# Patient Record
Sex: Male | Born: 1985 | Race: Black or African American | Hispanic: No | Marital: Single | Smoking: Never smoker
Health system: Southern US, Community
[De-identification: ages and names within clinical notes are randomized; demographics above are authoritative.]

## PROBLEM LIST (undated history)

## (undated) DIAGNOSIS — H9190 Unspecified hearing loss, unspecified ear: Secondary | ICD-10-CM

---

## 2013-10-26 ENCOUNTER — Emergency Department (HOSPITAL_COMMUNITY)
Admission: EM | Admit: 2013-10-26 | Discharge: 2013-10-26 | Disposition: A | Discharge status: Home / Self Care | Payer: Medicaid Other | Attending: Emergency Medicine | Admitting: Emergency Medicine

## 2013-10-26 ENCOUNTER — Emergency Department (HOSPITAL_COMMUNITY): Payer: Medicaid Other

## 2013-10-26 ENCOUNTER — Encounter (HOSPITAL_COMMUNITY): Payer: Self-pay | Admitting: Emergency Medicine

## 2013-10-26 DIAGNOSIS — H9190 Unspecified hearing loss, unspecified ear: Secondary | ICD-10-CM | POA: Insufficient documentation

## 2013-10-26 DIAGNOSIS — R112 Nausea with vomiting, unspecified: Secondary | ICD-10-CM | POA: Diagnosis not present

## 2013-10-26 DIAGNOSIS — R1115 Cyclical vomiting syndrome unrelated to migraine: Secondary | ICD-10-CM

## 2013-10-26 DIAGNOSIS — R1013 Epigastric pain: Secondary | ICD-10-CM | POA: Diagnosis not present

## 2013-10-26 HISTORY — DX: Unspecified hearing loss, unspecified ear: H91.90

## 2013-10-26 LAB — URINALYSIS, ROUTINE W REFLEX MICROSCOPIC
GLUCOSE, UA: NEGATIVE mg/dL
HGB URINE DIPSTICK: NEGATIVE
Leukocytes, UA: NEGATIVE
Nitrite: NEGATIVE
PH: 8 (ref 5.0–8.0)
Protein, ur: 30 mg/dL — AB
Specific Gravity, Urine: 1.038 — ABNORMAL HIGH (ref 1.005–1.030)
Urobilinogen, UA: 1 mg/dL (ref 0.0–1.0)

## 2013-10-26 LAB — CBC WITH DIFFERENTIAL/PLATELET
Basophils Absolute: 0 10*3/uL (ref 0.0–0.1)
Basophils Relative: 0 % (ref 0–1)
Eosinophils Absolute: 0.1 10*3/uL (ref 0.0–0.7)
Eosinophils Relative: 2 % (ref 0–5)
HEMATOCRIT: 43 % (ref 39.0–52.0)
Hemoglobin: 15.3 g/dL (ref 13.0–17.0)
LYMPHS ABS: 0.8 10*3/uL (ref 0.7–4.0)
LYMPHS PCT: 13 % (ref 12–46)
MCH: 31.2 pg (ref 26.0–34.0)
MCHC: 35.6 g/dL (ref 30.0–36.0)
MCV: 87.8 fL (ref 78.0–100.0)
MONO ABS: 0.4 10*3/uL (ref 0.1–1.0)
Monocytes Relative: 6 % (ref 3–12)
Neutro Abs: 4.9 10*3/uL (ref 1.7–7.7)
Neutrophils Relative %: 79 % — ABNORMAL HIGH (ref 43–77)
Platelets: 231 10*3/uL (ref 150–400)
RBC: 4.9 MIL/uL (ref 4.22–5.81)
RDW: 12.2 % (ref 11.5–15.5)
WBC: 6.3 10*3/uL (ref 4.0–10.5)

## 2013-10-26 LAB — COMPREHENSIVE METABOLIC PANEL
ALT: 22 U/L (ref 0–53)
AST: 22 U/L (ref 0–37)
Albumin: 4 g/dL (ref 3.5–5.2)
Alkaline Phosphatase: 90 U/L (ref 39–117)
Anion gap: 13 (ref 5–15)
BILIRUBIN TOTAL: 1.3 mg/dL — AB (ref 0.3–1.2)
BUN: 14 mg/dL (ref 6–23)
CO2: 27 meq/L (ref 19–32)
Calcium: 9.2 mg/dL (ref 8.4–10.5)
Chloride: 99 mEq/L (ref 96–112)
Creatinine, Ser: 1.03 mg/dL (ref 0.50–1.35)
GFR calc Af Amer: >90 mL/min (ref >90)
GLUCOSE: 103 mg/dL — AB (ref 70–99)
Potassium: 3.9 mEq/L (ref 3.7–5.3)
SODIUM: 139 meq/L (ref 137–147)
Total Protein: 7.9 g/dL (ref 6.0–8.3)

## 2013-10-26 LAB — RAPID URINE DRUG SCREEN, HOSP PERFORMED
AMPHETAMINES: NOT DETECTED
BARBITURATES: NOT DETECTED
BENZODIAZEPINES: NOT DETECTED
Cocaine: NOT DETECTED
Opiates: NOT DETECTED
TETRAHYDROCANNABINOL: POSITIVE — AB

## 2013-10-26 LAB — URINE MICROSCOPIC-ADD ON

## 2013-10-26 LAB — LIPASE, BLOOD: Lipase: 11 U/L (ref 11–59)

## 2013-10-26 MED ORDER — PROMETHAZINE HCL 25 MG/ML IJ SOLN
12.5000 mg | INTRAMUSCULAR | Status: DC | PRN
  Filled 2013-10-26 (×2): qty 1

## 2013-10-26 MED ORDER — FENTANYL CITRATE 0.05 MG/ML IJ SOLN
100.0000 ug | Freq: Once | INTRAMUSCULAR | Status: AC
  Administered 2013-10-26: 100 ug via INTRAVENOUS
  Filled 2013-10-26: qty 2

## 2013-10-26 MED ORDER — FENTANYL CITRATE 0.05 MG/ML IJ SOLN
INTRAMUSCULAR | Status: AC
  Filled 2013-10-26: qty 2

## 2013-10-26 MED ORDER — FENTANYL CITRATE 0.05 MG/ML IJ SOLN
50.0000 ug | Freq: Once | INTRAMUSCULAR | Status: AC
  Administered 2013-10-26: 50 ug via NASAL

## 2013-10-26 MED ORDER — ONDANSETRON HCL 4 MG/2ML IJ SOLN: 4.0000 mg | Freq: Once | INTRAMUSCULAR | Status: DC

## 2013-10-26 MED ORDER — SODIUM CHLORIDE 0.9 % IV BOLUS (SEPSIS)
1000.0000 mL | Freq: Once | INTRAVENOUS | Status: AC
  Administered 2013-10-26: 1000 mL via INTRAVENOUS

## 2013-10-26 MED ORDER — PROMETHAZINE HCL 25 MG PO TABS: 25.0000 mg | ORAL_TABLET | Freq: Four times a day (QID) | ORAL | Status: DC | PRN

## 2013-10-26 MED ORDER — ONDANSETRON 4 MG PO TBDP
8.0000 mg | ORAL_TABLET | Freq: Once | ORAL | Status: AC
  Administered 2013-10-26: 8 mg via ORAL
  Filled 2013-10-26: qty 2

## 2013-10-26 NOTE — Discharge Instructions (Signed)
Cyclic Vomiting Syndrome Cyclic vomiting syndrome is a benign condition in which patients experience bouts or cycles of severe nausea and vomiting that last for hours or even days. The bouts of nausea and vomiting alternate with longer periods of no symptoms and generally good health. Cyclic vomiting syndrome occurs mostly in children, but can affect adults. CAUSES  [facility] has no known cause. Each episode is typically similar to the previous ones. The episodes tend to:   Start at about the same time of day.  Last the same length of time.  Present the same symptoms at the same level of intensity. Cyclic vomiting syndrome can begin at any age in children and adults. Cyclic vomiting syndrome usually starts between the ages of 3 and 7 years. In adults, episodes tend to occur less often than they do in children, but they last longer. Furthermore, the events or situations that trigger episodes in adults cannot always be pinpointed as easily as they can in children. There are 4 phases of cyclic vomiting syndrome: 1. Prodrome. The prodrome phase signals that an episode of nausea and vomiting is about to begin. This phase can last from just a few minutes to several hours. This phase is often marked by belly (abdominal) pain. Sometimes taking medicine early in the prodrome phase can stop an episode in progress. However, sometimes there is no warning. A person may simply wake up in the middle of the night or early morning and begin vomiting. 2. Episode. The episode phase consists of:  Severe vomiting.  Nausea.  Gagging (retching). 3. Recovery. The recovery phase begins when the nausea and vomiting stop. Healthy color, appetite, and energy return. 4. Symptom-free interval. The symptom-free interval phase is the period between episodes when no symptoms are present. TRIGGERS Episodes can be triggered by an infection or event. Examples of triggers include:    Smoking Marijuana   Infections.  Colds,  allergies, sinus problems, and the flu.  Eating certain foods such as chocolate or cheese.  Foods with monosodium glutamate (MSG) or preservatives.  Fast foods.  Pre-packaged foods.  Foods with low nutritional value (junk foods).  Overeating.  Eating just before going to bed.  Hot weather.  Dehydration.  Not enough sleep or poor sleep quality.  Physical exhaustion.  Menstruation.  Motion sickness.  Emotional stress (school or home difficulties).  Excitement or stress. SYMPTOMS  The main symptoms of cyclic vomiting syndrome are:  Severe vomiting.  Nausea.  Gagging (retching). Episodes usually begin at night or the first thing in the morning. Episodes may include vomiting or retching up to 5 or 6 times an hour during the worst of the episode. Episodes usually last anywhere from 1 to 4 days. Episodes can last for up to 10 days. Other symptoms include:  Paleness.  Exhaustion.  Listlessness.  Abdominal pain.  Loose stools or diarrhea. Sometimes the nausea and vomiting are so severe that a person appears to be almost unconscious. Sensitivity to light, headache, fever, dizziness, may also accompany an episode. In addition, the vomiting may cause drooling and excessive thirst. Drinking water usually leads to more vomiting, though the water can dilute the acid in the vomit, making the episode a little less painful. Continuous vomiting can lead to dehydration, which means that the body has lost excessive water and salts. DIAGNOSIS  Cyclic vomiting syndrome is hard to diagnose because there are no clear tests to identify it. A caregiver must diagnose cyclic vomiting syndrome by looking at symptoms and medical history. A  caregiver must exclude more common diseases or disorders that can also cause nausea and vomiting. Also, diagnosis takes time because caregivers need to identify a pattern or cycle to the vomiting. TREATMENT  Cyclic vomiting syndrome cannot be cured.  Treatment varies, but people with cyclic vomiting syndrome should get plenty of rest and sleep and take medications that prevent, stop, or lessen the vomiting episodes and other symptoms. People whose episodes are frequent and long-lasting may be treated during the symptom-free intervals in an effort to prevent or ease future episodes. The symptom-free phase is a good time to eliminate anything known to trigger an episode. For example, if episodes are brought on by stress or excitement, this period is the time to find ways to reduce stress and stay calm. If sinus problems or allergies cause episodes, those conditions should be treated. The triggers listed above should be avoided or prevented. Because of the similarities between migraine and cyclic vomiting syndrome, caregivers treat some people with severe cyclic vomiting syndrome with drugs that are also used for migraine headaches. The drugs are designed to:  Prevent episodes.  Reduce their frequency.  Lessen their severity. HOME CARE INSTRUCTIONS Once a vomiting episode begins, treatment is supportive. It helps to stay in bed and sleep in a dark, quiet room. Severe nausea and vomiting may require hospitalization and intravenous (IV) fluids to prevent dehydration. Relaxing medications (sedatives) may help if the nausea continues. Sometimes, during the prodrome phase, it is possible to stop an episode from happening altogether. Only take over-the-counter or prescription medicines for pain, discomfort or fever as directed by your caregiver. Do not give aspirin to children. During the recovery phase, drinking water and replacing lost electrolytes (salts in the blood) are very important. Electrolytes are salts that the body needs to function well and stay healthy. Symptoms during the recovery phase can vary. Some people find that their appetites return to normal immediately, while others need to begin by drinking clear liquids and then move slowly to solid  food. RELATED COMPLICATIONS The severe vomiting that defines cyclic vomiting syndrome is a risk factor for several complications:  Dehydration--Vomiting causes the body to lose water quickly.  Electrolyte imbalance--Vomiting also causes the body to lose the important salts it needs to keep working properly.  Peptic esophagitis--The tube that connects the mouth to the stomach (esophagus) becomes injured from the stomach acid that comes up with the vomit.  Hematemesis--The esophagus becomes irritated and bleeds, so blood mixes with the vomit.  Mallory-Weiss tear--The lower end of the esophagus may tear open or the stomach may bruise from vomiting or retching.  Tooth decay--The acid in the vomit can hurt the teeth by corroding the tooth enamel. SEEK MEDICAL CARE IF: You have questions or problems. Document Released: 03/29/2001 Document Revised: 04/12/2011 Document Reviewed: 04/27/2010 [facility] Patient Information 2015 [place], [place]. This information is not intended to replace advice given to you by your health care provider. Make sure you discuss any questions you have with your health care provider.

## 2013-10-26 NOTE — ED Notes (Signed)
Pt. Refused wheelchair 

## 2013-10-26 NOTE — ED Notes (Signed)
Sign language interpretor here with pt.

## 2013-10-26 NOTE — ED Notes (Signed)
Pt is deaf, per interpretor pt is having epigastric pain pain and n/v since this am. Denies diarrhea. No acute distress is noted at this time.

## 2013-10-26 NOTE — ED Provider Notes (Signed)
CSN: [id number]     Arrival date & time 10/26/13  1716 History   First MD Initiated Contact with Patient 10/26/13 1949     Chief Complaint  Patient presents with  . Abdominal Pain  . Emesis      HPI Pt is deaf, per interpretor pt is having epigastric pain pain and n/v since this am. Denies diarrhea. No acute distress is noted at this time.  Past Medical History  Diagnosis Date  . Deaf    History reviewed. No pertinent past surgical history. History reviewed. No pertinent family history. History  Substance Use Topics  . Smoking status: Not on file  . Smokeless tobacco: Not on file  . Alcohol Use: No    Review of Systems  Unable to perform ROS: Patient nonverbal      Allergies  Review of patient's allergies indicates no known allergies.  Home Medications   Prior to Admission medications   Medication Sig Start Date End Date Taking? Authorizing Provider  promethazine (PHENERGAN) 25 MG tablet Take 1 tablet (25 mg total) by mouth every 6 (six) hours as needed for nausea or vomiting. 10/26/13   Nelia Shi, MD   BP 111/67  Pulse 91  Temp(Src) 99.2 F (37.3 C) (Oral)  Resp 20  SpO2 99% Physical Exam  Nursing note and vitals reviewed. Constitutional: He is oriented to person, place, and time. He appears well-developed and well-nourished. No distress.  HENT:  Head: Normocephalic and atraumatic.  Eyes: Pupils are equal, round, and reactive to light.  Neck: Normal range of motion.  Cardiovascular: Normal rate and intact distal pulses.   Pulmonary/Chest: No respiratory distress.  Abdominal: Normal appearance. He exhibits no distension and no mass. There is no tenderness. There is no rebound and no guarding.  Musculoskeletal: Normal range of motion.  Neurological: He is alert and oriented to person, place, and time. No cranial nerve deficit.  Skin: Skin is warm and dry. No rash noted.  Psychiatric: He has a normal mood and affect. His behavior is normal.    ED  Course  Procedures (including critical care time) Medications  fentaNYL (SUBLIMAZE) 0.05 MG/ML injection (  Not Given 10/26/13 2037)  promethazine (PHENERGAN) injection 12.5 mg (not administered)  fentaNYL (SUBLIMAZE) injection 50 mcg (50 mcg Nasal Given 10/26/13 1900)  ondansetron (ZOFRAN-ODT) disintegrating tablet 8 mg (8 mg Oral Given 10/26/13 1859)  sodium chloride 0.9 % bolus 1,000 mL (0 mLs Intravenous Stopped 10/26/13 2300)  fentaNYL (SUBLIMAZE) injection 100 mcg (100 mcg Intravenous Given 10/26/13 2018)    Labs Review Labs Reviewed  CBC WITH DIFFERENTIAL - Abnormal; Notable for the following:    Neutrophils Relative % 79 (*)    All other components within normal limits  COMPREHENSIVE METABOLIC PANEL - Abnormal; Notable for the following:    Glucose, Bld 103 (*)    Total Bilirubin 1.3 (*)    All other components within normal limits  URINALYSIS, ROUTINE W REFLEX MICROSCOPIC - Abnormal; Notable for the following:    Color, Urine AMBER (*)    Specific Gravity, Urine 1.038 (*)    Bilirubin Urine SMALL (*)    Ketones, ur >80 (*)    Protein, ur 30 (*)    All other components within normal limits  URINE RAPID DRUG SCREEN (HOSP PERFORMED) - Abnormal; Notable for the following:    Tetrahydrocannabinol POSITIVE (*)    All other components within normal limits  URINE MICROSCOPIC-ADD ON - Abnormal; Notable for the following:    Casts  HYALINE CASTS (*)    All other components within normal limits  LIPASE, BLOOD    Imaging Review Ct Renal Stone Study  10/26/2013   CLINICAL DATA:  Epigastric pain and nausea and vomiting since this morning.  EXAM: CT ABDOMEN AND PELVIS WITHOUT CONTRAST  TECHNIQUE: Multidetector CT imaging of the abdomen and pelvis was performed following the standard protocol without IV contrast.  COMPARISON:  None.  FINDINGS: 3 mm nodule in the left lung base probably represents calcified granuloma.  The kidneys appear symmetrical in size and shape. No hydronephrosis or  hydroureter. No renal, ureteral, or bladder stones. The bladder is decompressed.  The unenhanced appearance of the liver, spleen, gallbladder, pancreas, adrenal glands, kidneys, abdominal aorta, inferior vena cava, and retroperitoneal lymph nodes is unremarkable. The stomach is mildly distended with fluid and ingested material. Wall is not apparently thickened. Small bowel and colon are decompressed. No free air or free fluid in the abdomen. Abdominal wall musculature appears intact.  Pelvis: No pelvic mass or lymphadenopathy. No free or loculated pelvic fluid collections. No inflammatory changes in the sigmoid colon. The appendix is not identified but no inflammatory changes are suggested in the right lower quadrant. No destructive bone lesions.  IMPRESSION: No renal or ureteral stone or obstruction. No acute process demonstrated in the abdomen or pelvis on unenhanced imaging.   Electronically Signed   By: Burman Nieves M.D.   On: 10/26/2013 21:54     EKG Interpretation   Date/Time:  Friday October 26 2013 17:25:24 EDT Ventricular Rate:  118 PR Interval:  138 QRS Duration: 72 QT Interval:  296 QTC Calculation: 414 R Axis:   75 Text Interpretation:  Sinus tachycardia Biatrial enlargement Nonspecific  ST abnormality Abnormal ECG No previous tracing Confirmed by Symon Norwood  MD,  Terri Rorrer ([id number]) on 10/26/2013 7:49:57 PM     1 I discussed his marijuana use he had with  his wife. She admitted that he smokes a lot of marijuana.  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  Patient feeling much better and stable at time of discharge. MDM   Final diagnoses:  Non-intractable cyclical vomiting with nausea        Nelia Shi, MD 10/26/13 2312

## 2015-02-02 ENCOUNTER — Encounter (HOSPITAL_COMMUNITY): Payer: Self-pay | Admitting: Emergency Medicine

## 2015-02-02 ENCOUNTER — Emergency Department (HOSPITAL_COMMUNITY)
Admission: EM | Admit: 2015-02-02 | Discharge: 2015-02-02 | Disposition: A | Discharge status: Home / Self Care | Payer: Medicaid Other | Attending: Emergency Medicine | Admitting: Emergency Medicine

## 2015-02-02 DIAGNOSIS — Z79899 Other long term (current) drug therapy: Secondary | ICD-10-CM | POA: Insufficient documentation

## 2015-02-02 DIAGNOSIS — R1013 Epigastric pain: Secondary | ICD-10-CM | POA: Insufficient documentation

## 2015-02-02 DIAGNOSIS — H9190 Unspecified hearing loss, unspecified ear: Secondary | ICD-10-CM | POA: Insufficient documentation

## 2015-02-02 DIAGNOSIS — N179 Acute kidney failure, unspecified: Secondary | ICD-10-CM

## 2015-02-02 DIAGNOSIS — R1110 Vomiting, unspecified: Secondary | ICD-10-CM | POA: Diagnosis present

## 2015-02-02 DIAGNOSIS — R197 Diarrhea, unspecified: Secondary | ICD-10-CM

## 2015-02-02 DIAGNOSIS — R112 Nausea with vomiting, unspecified: Secondary | ICD-10-CM

## 2015-02-02 LAB — COMPREHENSIVE METABOLIC PANEL
ALBUMIN: 4.4 g/dL (ref 3.5–5.0)
ALK PHOS: 67 U/L (ref 38–126)
ALT: 30 U/L (ref 17–63)
AST: 37 U/L (ref 15–41)
Anion gap: 15 (ref 5–15)
BILIRUBIN TOTAL: 1.7 mg/dL — AB (ref 0.3–1.2)
BUN: 16 mg/dL (ref 6–20)
CALCIUM: 10.1 mg/dL (ref 8.9–10.3)
CO2: 20 mmol/L — ABNORMAL LOW (ref 22–32)
Chloride: 105 mmol/L (ref 101–111)
Creatinine, Ser: 1.41 mg/dL — ABNORMAL HIGH (ref 0.61–1.24)
GFR calc Af Amer: >60 mL/min (ref >60)
GFR calc non Af Amer: >60 mL/min (ref >60)
GLUCOSE: 148 mg/dL — AB (ref 65–99)
POTASSIUM: 3.7 mmol/L (ref 3.5–5.1)
SODIUM: 140 mmol/L (ref 135–145)
Total Protein: 8 g/dL (ref 6.5–8.1)

## 2015-02-02 LAB — URINALYSIS, ROUTINE W REFLEX MICROSCOPIC
BILIRUBIN URINE: NEGATIVE
Glucose, UA: NEGATIVE mg/dL
HGB URINE DIPSTICK: NEGATIVE
Ketones, ur: >80 mg/dL — AB
Leukocytes, UA: NEGATIVE
Nitrite: NEGATIVE
PH: 5.5 (ref 5.0–8.0)
Protein, ur: NEGATIVE mg/dL
SPECIFIC GRAVITY, URINE: 1.029 (ref 1.005–1.030)

## 2015-02-02 LAB — CBC
HEMATOCRIT: 42.4 % (ref 39.0–52.0)
Hemoglobin: 14.3 g/dL (ref 13.0–17.0)
MCH: 30.1 pg (ref 26.0–34.0)
MCHC: 33.7 g/dL (ref 30.0–36.0)
MCV: 89.3 fL (ref 78.0–100.0)
Platelets: 203 10*3/uL (ref 150–400)
RBC: 4.75 MIL/uL (ref 4.22–5.81)
RDW: 12.5 % (ref 11.5–15.5)
WBC: 9.8 10*3/uL (ref 4.0–10.5)

## 2015-02-02 LAB — LIPASE, BLOOD: Lipase: 19 U/L (ref 11–51)

## 2015-02-02 MED ORDER — ONDANSETRON 4 MG PO TBDP
ORAL_TABLET | ORAL | Status: AC
  Filled 2015-02-02: qty 2

## 2015-02-02 MED ORDER — FENTANYL CITRATE (PF) 100 MCG/2ML IJ SOLN
INTRAMUSCULAR | Status: AC
  Filled 2015-02-02: qty 2

## 2015-02-02 MED ORDER — PROMETHAZINE HCL 25 MG PO TABS: 25.0000 mg | ORAL_TABLET | Freq: Four times a day (QID) | ORAL | Status: AC | PRN

## 2015-02-02 MED ORDER — DICYCLOMINE HCL 20 MG PO TABS: 20.0000 mg | ORAL_TABLET | Freq: Two times a day (BID) | ORAL | Status: DC

## 2015-02-02 MED ORDER — FENTANYL CITRATE (PF) 100 MCG/2ML IJ SOLN
50.0000 ug | Freq: Once | INTRAMUSCULAR | Status: AC
  Administered 2015-02-02: 50 ug via NASAL

## 2015-02-02 MED ORDER — SODIUM CHLORIDE 0.9 % IV BOLUS (SEPSIS): 1000.0000 mL | Freq: Once | INTRAVENOUS | Status: DC

## 2015-02-02 MED ORDER — ONDANSETRON 4 MG PO TBDP
8.0000 mg | ORAL_TABLET | Freq: Once | ORAL | Status: AC
  Administered 2015-02-02: 8 mg via ORAL

## 2015-02-02 MED ORDER — FAMOTIDINE 20 MG PO TABS: 20.0000 mg | ORAL_TABLET | Freq: Two times a day (BID) | ORAL | Status: AC

## 2015-02-02 MED ORDER — SODIUM CHLORIDE 0.9 % IV BOLUS (SEPSIS)
2000.0000 mL | Freq: Once | INTRAVENOUS | Status: AC
Start: 2015-02-02 — End: 2015-02-02
  Administered 2015-02-02: 2000 mL via INTRAVENOUS

## 2015-02-02 MED ORDER — ONDANSETRON HCL 4 MG/2ML IJ SOLN
4.0000 mg | Freq: Once | INTRAMUSCULAR | Status: AC
  Administered 2015-02-02: 4 mg via INTRAVENOUS
  Filled 2015-02-02: qty 2

## 2015-02-02 NOTE — ED Provider Notes (Signed)
CSN: [id number]     Arrival date & time 02/02/15  1854 History   First MD Initiated Contact with Patient 02/02/15 2041     Chief Complaint  Patient presents with  . Emesis     (Consider location/radiation/quality/duration/timing/severity/associated sxs/prior Treatment) HPI  Deaf interpreter used for communication  Blood pressure 107/73, pulse 115, temperature 97.5 F (36.4 C), temperature source Oral, resp. rate 20, SpO2 100 %.  Nathan Bass is a 30 y.o. male complaining of 5 episodes of nonbloody, nonbilious, non-coffee ground emesis with associated 5 episodes of soft with normal stool and epigastric pain, initially the pain was severe, 7-8 out of 10, to resolve to 2-3 out of 10 at this point. He has positive sick contacts in that his wife and son also have similar symptoms. Denies fever, chills, dysuria, decreased urinary output, history of kidney disease.  Past Medical History  Diagnosis Date  . Deaf    History reviewed. No pertinent past surgical history. No family history on file. Social History  Substance Use Topics  . Smoking status: Never Smoker   . Smokeless tobacco: None  . Alcohol Use: No    Review of Systems  10 systems reviewed and found to be negative, except as noted in the HPI.   Allergies  Review of patient's allergies indicates no known allergies.  Home Medications   Prior to Admission medications   Medication Sig Start Date End Date Taking? Authorizing Provider  acetaminophen (TYLENOL) 500 MG tablet Take 1,000 mg by mouth every 6 (six) hours as needed for mild pain.   Yes Historical Provider, MD  ibuprofen (ADVIL,MOTRIN) 200 MG tablet Take 400-800 mg by mouth every 6 (six) hours as needed for headache.   Yes Historical Provider, MD  dicyclomine (BENTYL) 20 MG tablet Take 1 tablet (20 mg total) by mouth 2 (two) times daily. 02/02/15   Caster Fayette, PA-C  famotidine (PEPCID) 20 MG tablet Take 1 tablet (20 mg total) by mouth 2 (two) times daily.  02/02/15   Ronnika Collett, PA-C  promethazine (PHENERGAN) 25 MG tablet Take 1 tablet (25 mg total) by mouth every 6 (six) hours as needed for nausea or vomiting. 02/02/15   Genia Perin, PA-C   BP 106/75 mmHg  Pulse 93  Temp(Src) 98.7 F (37.1 C) (Oral)  Resp 16  SpO2 100% Physical Exam  Constitutional: He is oriented to person, place, and time. He appears well-developed and well-nourished. No distress.  HENT:  Head: Normocephalic and atraumatic.  Mouth/Throat: Oropharynx is clear and moist.  Eyes: Conjunctivae and EOM are normal.  Cardiovascular: Normal rate, regular rhythm and intact distal pulses.   Pulmonary/Chest: Effort normal and breath sounds normal. No stridor. No respiratory distress. He has no rales. He exhibits no tenderness.  Abdominal: Soft. Bowel sounds are normal. He exhibits no distension and no mass. There is tenderness. There is no rebound and no guarding.  Mild, diffuse tenderness to palpation in the epigastrium with no guarding or rebound.  Murphy sign negative, no tenderness to palpation over McBurney's point, Rovsings, Psoas and obturator all negative.   Musculoskeletal: Normal range of motion.  Neurological: He is alert and oriented to person, place, and time.  Psychiatric: He has a normal mood and affect.  Nursing note and vitals reviewed.   ED Course  Procedures (including critical care time) Labs Review Labs Reviewed  COMPREHENSIVE METABOLIC PANEL - Abnormal; Notable for the following:    CO2 20 (*)    Glucose, Bld 148 (*)  Creatinine, Ser 1.41 (*)    Total Bilirubin 1.7 (*)    All other components within normal limits  URINALYSIS, ROUTINE W REFLEX MICROSCOPIC (NOT AT [facility]) - Abnormal; Notable for the following:    Color, Urine AMBER (*)    Ketones, ur >80 (*)    All other components within normal limits  LIPASE, BLOOD  CBC    Imaging Review No results found. I have personally reviewed and evaluated these images and lab results as part of  my medical decision-making.   EKG Interpretation None      MDM   Final diagnoses:  Acute kidney injury (HCC)  Nausea vomiting and diarrhea  Epigastric abdominal pain    Filed Vitals:   02/02/15 1922 02/02/15 2304  BP: 107/73 106/75  Pulse: 115 93  Temp: 97.5 F (36.4 C) 98.7 F (37.1 C)  TempSrc: Oral Oral  Resp: 20 16  SpO2: 100% 100%    Medications  fentaNYL (SUBLIMAZE) injection 50 mcg (50 mcg Nasal Given 02/02/15 1937)  ondansetron (ZOFRAN-ODT) disintegrating tablet 8 mg (8 mg Oral Given 02/02/15 1937)  ondansetron (ZOFRAN) injection 4 mg (4 mg Intravenous Given 02/02/15 2113)  sodium chloride 0.9 % bolus 2,000 mL (0 mLs Intravenous Stopped 02/02/15 2305)    Nathan Bass is 30 y.o. male presenting with nausea vomiting diarrhea and improved abdominal pain onset today, wife and daughter sick with similar. Abdominal exam is nonsurgical. Patient is initially tachycardic however this is resolved after fluids. He has elevated creatinine at 1.4, no history of kidney dysfunction. He also has 80 ketones in the urine, no significant hyperglycemia to suggest DKA. Repeat abdominal exam remains benign, patient has passed oral fluid challenge and  feels much better.    She has appointment with primary care physician tomorrow morning, advised him to push fluids, stay away from NSAIDs he will need to have his creatinine rechecked tomorrow.  Evaluation does not show pathology that would require ongoing emergent intervention or inpatient treatment. Pt is hemodynamically stable and mentating appropriately. Discussed findings and plan with patient/guardian, who agrees with care plan. All questions answered. Return precautions discussed and outpatient follow up given.    [name] Anayeli Arel, PA-C 02/02/15 1610  Derwood Kaplan, MD 02/05/15 [phone]

## 2015-02-02 NOTE — ED Notes (Signed)
Pt. reports mid abdominal pain with emesis and diarrhea onset this afternoon , denies fever or chills.

## 2015-02-02 NOTE — Discharge Instructions (Signed)
Your kidney tests today are not normal. You need to drink plenty of water and have this rechecked by your primary care doctor in the next week. Do not take any NSAIDs (motrin, ibuprofen, Advil, aleve , aspirin, naproxen etc.) because they can further hurt your kidneys.   Return to the emergency room for severely worsening abdominal pain, abdominal pain that localizes to a particular area (especially the right lower part of the belly), pain that persists past 8-10 hours, blood in stool or vomit, severe weakness, fainting, or fever.   Push fluids: take small frequent sips of water or Gatorade, do not drink any soda, juice or caffeinated beverages.    Slowly resume solid diet as desired. Avoid food that are spicy, contain dairy and/or have high fat content.   Acute Kidney Injury Acute kidney injury is any condition in which there is sudden (acute) damage to the kidneys. Acute kidney injury was previously known as acute kidney failure or acute renal failure. The kidneys are two organs that lie on either side of the spine between the middle of the back and the front of the abdomen. The kidneys:  Remove wastes and extra water from the blood.   Produce important hormones. These help keep bones strong, regulate blood pressure, and help create red blood cells.   Balance the fluids and chemicals in the blood and tissues. A small amount of kidney damage may not cause problems, but a large amount of damage may make it difficult or impossible for the kidneys to work the way they should. Acute kidney injury may develop into long-lasting (chronic) kidney disease. It may also develop into a life-threatening disease called end-stage kidney disease. Acute kidney injury can get worse very quickly, so it should be treated right away. Early treatment may prevent other kidney diseases from developing. CAUSES   A problem with blood flow to the kidneys. This may be caused by:   Blood loss.   Heart disease.    Severe burns.   Liver disease.  Direct damage to the kidneys. This may be caused by:  Some medicines.   A kidney infection.   Poisoning or consuming toxic substances.   A surgical wound.   A blow to the kidney area.   A problem with urine flow. This may be caused by:   Cancer.   Kidney stones.   An enlarged prostate. SIGNS AND SYMPTOMS   Swelling (edema) of the legs, ankles, or feet.   Tiredness (lethargy).   Nausea or vomiting.   Confusion.   Problems with urination, such as:   Painful or burning feeling during urination.   Decreased urine production.   Frequent accidents in children who are potty trained.   Bloody urine.   Muscle twitches and cramps.   Shortness of breath.   Seizures.   Chest pain or pressure. Sometimes, no symptoms are present. DIAGNOSIS Acute kidney injury may be detected and diagnosed by tests, including blood, urine, imaging, or kidney biopsy tests.  TREATMENT Treatment of acute kidney injury varies depending on the cause and severity of the kidney damage. In mild cases, no treatment may be needed. The kidneys may heal on their own. If acute kidney injury is more severe, your health care provider will treat the cause of the kidney damage, help the kidneys heal, and prevent complications from occurring. Severe cases may require a procedure to remove toxic wastes from the body (dialysis) or surgery to repair kidney damage. Surgery may involve:   Repair of a  torn kidney.   Removal of an obstruction. HOME CARE INSTRUCTIONS  Follow your prescribed diet.  Take medicines only as directed by your health care provider.  Do not take any new medicines (prescription, over-the-counter, or nutritional supplements) unless approved by your health care provider. Many medicines can worsen your kidney damage or may need to have the dose adjusted.   Keep all follow-up visits as directed by your health care provider.  This is important.  Observe your condition to make sure you are healing as expected. SEEK IMMEDIATE MEDICAL CARE IF:  You are feeling ill or have severe pain in the back or side.   Your symptoms return or you have new symptoms.  You have any symptoms of end-stage kidney disease. These include:   Persistent itchiness.   Loss of appetite.   Headaches.   Abnormally dark or light skin.  Numbness in the hands or feet.   Easy bruising.   Frequent hiccups.   Menstruation stops.   You have a fever.  You have increased urine production.  You have pain or bleeding when urinating. MAKE SURE YOU:   Understand these instructions.  Will watch your condition.  Will get help right away if you are not doing well or get worse.   This information is not intended to replace advice given to you by your health care provider. Make sure you discuss any questions you have with your health care provider.   Document Released: 08/03/2010 Document Revised: 02/08/2014 Document Reviewed: 09/17/2011 Elsevier Interactive Patient Education [employer]2016 [employer]

## 2015-02-02 NOTE — ED Notes (Signed)
Patient verbalized understanding of discharge instructions and denies any further needs or questions at this time. VS stable. Patient ambulatory with steady gait.  

## 2015-08-29 ENCOUNTER — Encounter (HOSPITAL_COMMUNITY): Payer: Self-pay | Admitting: Emergency Medicine

## 2015-08-29 ENCOUNTER — Ambulatory Visit (HOSPITAL_COMMUNITY)
Admission: EM | Admit: 2015-08-29 | Discharge: 2015-08-29 | Disposition: A | Discharge status: Home / Self Care | Payer: Medicaid Other | Attending: Emergency Medicine | Admitting: Emergency Medicine

## 2015-08-29 DIAGNOSIS — R109 Unspecified abdominal pain: Secondary | ICD-10-CM | POA: Diagnosis not present

## 2015-08-29 LAB — POCT I-STAT, CHEM 8
BUN: 8 mg/dL (ref 6–20)
CALCIUM ION: 1.24 mmol/L (ref 1.13–1.30)
CREATININE: 1 mg/dL (ref 0.61–1.24)
Chloride: 98 mmol/L — ABNORMAL LOW (ref 101–111)
GLUCOSE: 93 mg/dL (ref 65–99)
HCT: 46 % (ref 39.0–52.0)
HEMOGLOBIN: 15.6 g/dL (ref 13.0–17.0)
Potassium: 4.1 mmol/L (ref 3.5–5.1)
Sodium: 140 mmol/L (ref 135–145)
TCO2: 28 mmol/L (ref 0–100)

## 2015-08-29 MED ORDER — DICYCLOMINE HCL 20 MG PO TABS: 20.0000 mg | ORAL_TABLET | Freq: Three times a day (TID) | ORAL | 0 refills | Status: AC

## 2015-08-29 NOTE — ED Triage Notes (Signed)
The patient presented to the [facility] with a complaint of abdominal cramping that started 2 days ago after eating greasy food.

## 2015-08-29 NOTE — ED Notes (Signed)
Video interpreter # [phone] used for triage.

## 2015-08-29 NOTE — Discharge Instructions (Signed)
So sorry you are having GI problems. The medication may help with the spasms and cramping. As we discussed please keep f/u with new PCP for further evaluation and any proper referrals. Nice to meet you, may want to avoid foods high in fat and grease until appts can be arranged.

## 2015-08-29 NOTE — ED Provider Notes (Signed)
CSN: [id number]     Arrival date & time 08/29/15  1152 History   None    Chief Complaint  Patient presents with  . Abdominal Pain   (Consider location/radiation/quality/duration/timing/severity/associated sxs/prior Treatment) 30 yo black male presents with abdominal "cramping". His history is obtained through sign language appointed to him.  Patient reports periodic cramping for 2 weeks. He was seen in the ER in [place] and per patient had a normal CT scan of his abdomen. By report his blood work was also normal. His pain is peri-umbilical. It can occur after eating but not always. In the last few days he notes that these episodes are more frequent. He has no diarrhea, nausea or vomiting. No constipation. No fever or chills and no urinary symptoms. See remainder of ROS.     Past Medical History:  Diagnosis Date  . Deaf    History reviewed. No pertinent surgical history. History reviewed. No pertinent family history. Social History  Substance Use Topics  . Smoking status: Never Smoker  . Smokeless tobacco: Never Used  . Alcohol use No    Review of Systems  Constitutional: Negative for chills, fever and unexpected weight change.  Gastrointestinal: Positive for abdominal pain. Negative for abdominal distention, anal bleeding, blood in stool, constipation, diarrhea, nausea and vomiting.  Genitourinary: Negative.   Musculoskeletal: Negative for back pain.  Skin: Negative for rash.    Allergies  Review of patient's allergies indicates no known allergies.  Home Medications   Prior to Admission medications   Medication Sig Start Date End Date Taking? Authorizing Provider  promethazine (PHENERGAN) 25 MG tablet Take 1 tablet (25 mg total) by mouth every 6 (six) hours as needed for nausea or vomiting. 02/02/15  Yes [name], PA-C  acetaminophen (TYLENOL) 500 MG tablet Take 1,000 mg by mouth every 6 (six) hours as needed for mild pain.    Historical Provider, MD  dicyclomine  (BENTYL) 20 MG tablet Take 1 tablet (20 mg total) by mouth 3 (three) times daily before meals. 08/29/15   [name], PA-C  famotidine (PEPCID) 20 MG tablet Take 1 tablet (20 mg total) by mouth 2 (two) times daily. 02/02/15   [name], PA-C  ibuprofen (ADVIL,MOTRIN) 200 MG tablet Take 400-800 mg by mouth every 6 (six) hours as needed for headache.    Historical Provider, MD   Meds Ordered and Administered this Visit  Medications - No data to display  BP 123/80 (BP Location: Left Arm)   Pulse 79   Temp 98.3 F (36.8 C) (Oral)   Resp 16   SpO2 100%  No data found.   Physical Exam  Constitutional: He is oriented to person, place, and time. He appears well-developed and well-nourished. No distress.  Lying comfortable on exam table, non-toxic and in no acute distress  Abdominal: Soft. Bowel sounds are normal. He exhibits no distension and no mass. There is tenderness. There is no rebound and no guarding. No hernia.  Pain to palpation peri-umbilical region. No rebound or guarding. Non-acute exam  Neurological: He is alert and oriented to person, place, and time.  Skin: Skin is warm. No rash noted. He is not diaphoretic.  Psychiatric: His behavior is normal.  Nursing note and vitals reviewed.   Urgent Care Course   Clinical Course    Procedures (including critical care time)  Labs Review Labs Reviewed  POCT I-STAT, CHEM 8 - Abnormal; Notable for the following:       Result Value   Chloride  98 (*)    All other components within normal limits    Imaging Review No results found.   Visual Acuity Review  Right Eye Distance:   Left Eye Distance:   Bilateral Distance:    Right Eye Near:   Left Eye Near:    Bilateral Near:         MDM   1. Abdominal cramping    Prior work up in [place] per patient was normal, this included a CT scan and labs. Exam today shows a non-acute abdomen or emergent needs. No signs of infection at this time. He has f/u with a  new PCP under medicaid and I recommend a f/u there so that he can be evaluated further with proper referrals. He did not want to go to ER as his pain is manageable. I refilled Bentyl as this was given in the past for similar issues. Avoid high fat or greasy foods until appropriate f/u. Patient is non-emergent and stable for D/C. Should he worsen he will present to ED.     [name], PA-C 08/29/15 1440

## 2015-08-29 NOTE — ED Notes (Signed)
ASL interpreter used.  

## 2015-12-11 ENCOUNTER — Other Ambulatory Visit: Payer: Self-pay | Admitting: Gastroenterology

## 2015-12-11 DIAGNOSIS — R1013 Epigastric pain: Secondary | ICD-10-CM

## 2015-12-11 DIAGNOSIS — R112 Nausea with vomiting, unspecified: Secondary | ICD-10-CM

## 2015-12-19 ENCOUNTER — Other Ambulatory Visit: Payer: Medicaid Other

## 2016-01-05 ENCOUNTER — Ambulatory Visit
Admission: RE | Admit: 2016-01-05 | Discharge: 2016-01-05 | Disposition: A | Discharge status: Home / Self Care | Payer: Medicaid Other | Source: Ambulatory Visit | Attending: Gastroenterology | Admitting: Gastroenterology

## 2016-01-05 DIAGNOSIS — R112 Nausea with vomiting, unspecified: Secondary | ICD-10-CM

## 2016-01-05 DIAGNOSIS — R1013 Epigastric pain: Secondary | ICD-10-CM

## 2016-01-16 ENCOUNTER — Emergency Department (HOSPITAL_COMMUNITY): Payer: Medicaid Other

## 2016-01-16 ENCOUNTER — Encounter (HOSPITAL_COMMUNITY): Payer: Self-pay | Admitting: Emergency Medicine

## 2016-01-16 ENCOUNTER — Emergency Department (HOSPITAL_COMMUNITY)
Admission: EM | Admit: 2016-01-16 | Discharge: 2016-01-16 | Disposition: A | Discharge status: Home / Self Care | Payer: Medicaid Other | Attending: Emergency Medicine | Admitting: Emergency Medicine

## 2016-01-16 DIAGNOSIS — R110 Nausea: Secondary | ICD-10-CM

## 2016-01-16 DIAGNOSIS — R55 Syncope and collapse: Secondary | ICD-10-CM | POA: Diagnosis present

## 2016-01-16 LAB — URINALYSIS, ROUTINE W REFLEX MICROSCOPIC
Bilirubin Urine: NEGATIVE
GLUCOSE, UA: NEGATIVE mg/dL
Hgb urine dipstick: NEGATIVE
KETONES UR: 80 mg/dL — AB
LEUKOCYTES UA: NEGATIVE
NITRITE: NEGATIVE
PH: 7 (ref 5.0–8.0)
Protein, ur: NEGATIVE mg/dL
SPECIFIC GRAVITY, URINE: 1.014 (ref 1.005–1.030)

## 2016-01-16 LAB — CBG MONITORING, ED: Glucose-Capillary: 91 mg/dL (ref 65–99)

## 2016-01-16 LAB — CBC
HEMATOCRIT: 35.6 % — AB (ref 39.0–52.0)
HEMOGLOBIN: 12.6 g/dL — AB (ref 13.0–17.0)
MCH: 30.7 pg (ref 26.0–34.0)
MCHC: 35.4 g/dL (ref 30.0–36.0)
MCV: 86.6 fL (ref 78.0–100.0)
Platelets: 192 10*3/uL (ref 150–400)
RBC: 4.11 MIL/uL — ABNORMAL LOW (ref 4.22–5.81)
RDW: 12.9 % (ref 11.5–15.5)
WBC: 6.1 10*3/uL (ref 4.0–10.5)

## 2016-01-16 LAB — BASIC METABOLIC PANEL
ANION GAP: 9 (ref 5–15)
BUN: 13 mg/dL (ref 6–20)
CHLORIDE: 101 mmol/L (ref 101–111)
CO2: 26 mmol/L (ref 22–32)
Calcium: 9.3 mg/dL (ref 8.9–10.3)
Creatinine, Ser: 0.99 mg/dL (ref 0.61–1.24)
GFR calc Af Amer: >60 mL/min (ref >60)
GLUCOSE: 99 mg/dL (ref 65–99)
POTASSIUM: 3.5 mmol/L (ref 3.5–5.1)
SODIUM: 136 mmol/L (ref 135–145)

## 2016-01-16 MED ORDER — ONDANSETRON HCL 4 MG/2ML IJ SOLN
4.0000 mg | Freq: Once | INTRAMUSCULAR | Status: AC
  Administered 2016-01-16: 4 mg via INTRAVENOUS
  Filled 2016-01-16: qty 2

## 2016-01-16 MED ORDER — ONDANSETRON 4 MG PO TBDP: 4.0000 mg | ORAL_TABLET | Freq: Three times a day (TID) | ORAL | 0 refills | Status: DC | PRN

## 2016-01-16 NOTE — ED Triage Notes (Signed)
Pt comes from home via EMS with complaints of a syncopal episode that occurred about an hour ago.  Fiance came in and noted patient lying supine on the kitchen floor with his eyes closed.  On arrival, EMS noted patient was still lying on the floor but with eyes open.  When fiance would walk back near the patient, he would close his eyes again.  Sister of pt showed up to scene and stated his mother died recently and he has been having relationship troubles.  Stated he has been seen recently for stomach issues as well and has been taking Protonix and dicyclomine.  Pt told EMS (via writing; pt deaf) that he got dizzy and is feeling nauseated. Pt also complaining of some occipital head pain with no signs of injury or trauma.

## 2016-01-16 NOTE — ED Notes (Signed)
Fluids provided to pt and encouraged to drink 

## 2016-01-16 NOTE — ED Notes (Signed)
Bed: [id number] Expected date:  Expected time:  Means of arrival:  Comments: Syncopal episode/pt is deaf

## 2016-01-16 NOTE — Discharge Instructions (Signed)
Please make an appointment in wellness to establish primary medical care.  Return anytime your symptoms become worse.  He been given a prescription for Zofran that you can use to control periods of nausea

## 2016-01-16 NOTE — ED Notes (Signed)
Pt tolerating oral fluids 

## 2016-01-16 NOTE — ED Notes (Signed)
Pt in X-Ray ?

## 2016-01-16 NOTE — ED Provider Notes (Signed)
[facility]-EMERGENCY DEPT Provider Note   CSN: [id number] Arrival date & time: 01/16/16  [id number] By signing my name below, I, Nathan Bass, attest that this documentation has been prepared under the direction and in the presence of Nathan FavorGail Leo Weyandt, FNP. Electronically Signed: Bridgette HabermannMaria Bass, ED Scribe. 01/16/16. 2:03 AM.  History   Chief Complaint Chief Complaint  Patient presents with  . Loss of Consciousness  . Nausea   HPI Comments: Nathan Bass is a 30 y.o. male with no pertinent PMHx, who presents to the Emergency Department by EMS complaining of a syncopal episode that occurred ~1 hour ago. Pt states he was nauseous all day prior to his syncopal episode, and experienced some dizziness just prior to his episode. He is complaining of mild occipital head pain but denies any visual disturbances. Per EMS, pt was found by fiance, lying supine on the kitchen floor with eyes closed. EMS noted pt was still lying on the floor upon their arrival but with his eyes open. When fiance would walk back near the patient, he would close his eyes again. Pt's sister showed up on scene and told EMS that their mother died recently and he has been having relationship issues with his fiance. Pt does not think these issues are related to his nauseous episodes. He notes that he has had these nauseous episodes for ~2 years already. Has h/o syncopal episodes but does not know the cause. Pt states he has been seen recently for GI issues, and he has been taking Protonix and Dicyclomine.  Denies any recent illness. Pt denies diarrhea, fever, cough, or any other associated symptoms.   The history is provided by the patient and the EMS personnel. No language interpreter was used.    Past Medical History:  Diagnosis Date  . Deaf     There are no active problems to display for this patient.   History reviewed. No pertinent surgical history.     Home Medications    Prior to Admission medications   Medication Sig Start Date  End Date Taking? Authorizing Provider  acetaminophen (TYLENOL) 500 MG tablet Take 1,000 mg by mouth every 6 (six) hours as needed for mild pain.    Historical Provider, MD  dicyclomine (BENTYL) 20 MG tablet Take 1 tablet (20 mg total) by mouth 3 (three) times daily before meals. 08/29/15   Riki SheerMichelle G Young, PA-C  famotidine (PEPCID) 20 MG tablet Take 1 tablet (20 mg total) by mouth 2 (two) times daily. 02/02/15   [name], PA-C  ibuprofen (ADVIL,MOTRIN) 200 MG tablet Take 400-800 mg by mouth every 6 (six) hours as needed for headache.    Historical Provider, MD  promethazine (PHENERGAN) 25 MG tablet Take 1 tablet (25 mg total) by mouth every 6 (six) hours as needed for nausea or vomiting. 02/02/15   Joni ReiningNicole Pisciotta, PA-C    Family History No family history on file.  Social History Social History  Substance Use Topics  . Smoking status: Never Smoker  . Smokeless tobacco: Never Used  . Alcohol use No     Allergies   Patient has no known allergies.   Review of Systems Review of Systems  Constitutional: Negative for chills and fever.  Eyes: Negative for visual disturbance.  Respiratory: Negative for cough.   Gastrointestinal: Positive for nausea. Negative for diarrhea.  Neurological: Positive for dizziness, syncope and headaches.  All other systems reviewed and are negative.    Physical Exam Updated Vital Signs Ht 5\' 10"  (1.778 m)   Wt  145 lb (65.8 kg)   BMI 20.81 kg/m   Physical Exam  Constitutional: He appears well-developed and well-nourished.  HENT:  Head: Normocephalic.  Eyes: Conjunctivae are normal.  Cardiovascular: Normal rate.   Pulmonary/Chest: Effort normal. No respiratory distress.  Abdominal: He exhibits no distension.  Musculoskeletal: Normal range of motion.  Neurological: He is alert.  Skin: Skin is warm and dry.  Psychiatric: He has a normal mood and affect. His behavior is normal.  Nursing note and vitals reviewed.    ED Treatments / Results   COORDINATION OF CARE: 1:58 AM Discussed treatment plan with pt at bedside which includes head CT and pt agreed to plan.  Labs (all labs ordered are listed, but only abnormal results are displayed) Labs Reviewed  CBC - Abnormal; Notable for the following:       Result Value   RBC 4.11 (*)    Hemoglobin 12.6 (*)    HCT 35.6 (*)    All other components within normal limits  URINALYSIS, ROUTINE W REFLEX MICROSCOPIC - Abnormal; Notable for the following:    Ketones, ur 80 (*)    All other components within normal limits  BASIC METABOLIC PANEL  CBG MONITORING, ED    EKG  EKG Interpretation  Date/Time:  Friday January 16 2016 01:07:41 EST Ventricular Rate:  81 PR Interval:    QRS Duration: 66 QT Interval:  363 QTC Calculation: 422 R Axis:   75 Text Interpretation:  Sinus rhythm Ventricular premature complex Probable anteroseptal infarct, old ST elevation suggests acute pericarditis Since last tracing ST abnormality is new Confirmed by [name]  MD, ELLIOTT ([id number]) on 01/16/2016 1:18:49 AM       Radiology No results found.  Procedures Procedures (including critical care time)  Medications Ordered in ED Medications - No data to display   Initial Impression / Assessment and Plan / ED Course  I have reviewed the triage vital signs and the nursing notes.  Pertinent labs & imaging results that were available during my care of the patient were reviewed by me and considered in my medical decision making (see chart for details).  Clinical Course    Patient was evaluated for his syncopal episode with nausea.  He was given antiemetic scan of his head reveals no pathology to explain his symptoms.  No fractures or hemorrhage noted. Patient has been observed.  He's been given by mouth fluids without any return of his nausea.  I feel it is safe for him to be discharged home.  Follow-up with PCP were established with community wellness. He has been given a prescription for Zofran that  he can use for any recurrent episodes of nausea    Final Clinical Impressions(s) / ED Diagnoses   Final diagnoses:  None    New Prescriptions New Prescriptions   No medications on file   I personally performed the services described in this documentation, which was scribed in my presence. The recorded information has been reviewed and is accurate.    Nathan FavorGail Zarian Colpitts, NP 01/16/16 [id number]    Mancel BaleElliott Wentz, MD 01/16/16 [id number]

## 2016-04-13 ENCOUNTER — Encounter (HOSPITAL_COMMUNITY): Payer: Self-pay | Admitting: Emergency Medicine

## 2016-04-13 ENCOUNTER — Emergency Department (HOSPITAL_COMMUNITY)
Admission: EM | Admit: 2016-04-13 | Discharge: 2016-04-13 | Disposition: A | Discharge status: Home / Self Care | Payer: Medicaid Other | Attending: Emergency Medicine | Admitting: Emergency Medicine

## 2016-04-13 DIAGNOSIS — R569 Unspecified convulsions: Secondary | ICD-10-CM | POA: Insufficient documentation

## 2016-04-13 DIAGNOSIS — Z79899 Other long term (current) drug therapy: Secondary | ICD-10-CM | POA: Diagnosis not present

## 2016-04-13 DIAGNOSIS — R1013 Epigastric pain: Secondary | ICD-10-CM | POA: Diagnosis not present

## 2016-04-13 LAB — COMPREHENSIVE METABOLIC PANEL
ALBUMIN: 4.4 g/dL (ref 3.5–5.0)
ALT: 17 U/L (ref 17–63)
ANION GAP: 12 (ref 5–15)
AST: 24 U/L (ref 15–41)
Alkaline Phosphatase: 70 U/L (ref 38–126)
BUN: 9 mg/dL (ref 6–20)
CO2: 23 mmol/L (ref 22–32)
Calcium: 9.5 mg/dL (ref 8.9–10.3)
Chloride: 102 mmol/L (ref 101–111)
Creatinine, Ser: 1.1 mg/dL (ref 0.61–1.24)
GFR calc non Af Amer: >60 mL/min (ref >60)
Glucose, Bld: 102 mg/dL — ABNORMAL HIGH (ref 65–99)
POTASSIUM: 3.7 mmol/L (ref 3.5–5.1)
Sodium: 137 mmol/L (ref 135–145)
TOTAL PROTEIN: 7.7 g/dL (ref 6.5–8.1)
Total Bilirubin: 1.1 mg/dL (ref 0.3–1.2)

## 2016-04-13 LAB — CBC WITH DIFFERENTIAL/PLATELET
BASOS PCT: 0 %
Basophils Absolute: 0 10*3/uL (ref 0.0–0.1)
EOS ABS: 0 10*3/uL (ref 0.0–0.7)
EOS PCT: 0 %
HCT: 41.1 % (ref 39.0–52.0)
Hemoglobin: 14.4 g/dL (ref 13.0–17.0)
Lymphocytes Relative: 29 %
Lymphs Abs: 2.7 10*3/uL (ref 0.7–4.0)
MCH: 31 pg (ref 26.0–34.0)
MCHC: 35 g/dL (ref 30.0–36.0)
MCV: 88.6 fL (ref 78.0–100.0)
MONO ABS: 0.5 10*3/uL (ref 0.1–1.0)
MONOS PCT: 5 %
NEUTROS PCT: 66 %
Neutro Abs: 5.9 10*3/uL (ref 1.7–7.7)
PLATELETS: 237 10*3/uL (ref 150–400)
RBC: 4.64 MIL/uL (ref 4.22–5.81)
RDW: 12.4 % (ref 11.5–15.5)
WBC: 9.1 10*3/uL (ref 4.0–10.5)

## 2016-04-13 LAB — RAPID URINE DRUG SCREEN, HOSP PERFORMED
AMPHETAMINES: NOT DETECTED
Barbiturates: NOT DETECTED
Benzodiazepines: NOT DETECTED
Cocaine: NOT DETECTED
Opiates: NOT DETECTED
TETRAHYDROCANNABINOL: POSITIVE — AB

## 2016-04-13 LAB — URINALYSIS, ROUTINE W REFLEX MICROSCOPIC
BACTERIA UA: NONE SEEN
BILIRUBIN URINE: NEGATIVE
GLUCOSE, UA: NEGATIVE mg/dL
HGB URINE DIPSTICK: NEGATIVE
Ketones, ur: 80 mg/dL — AB
LEUKOCYTES UA: NEGATIVE
NITRITE: NEGATIVE
Protein, ur: 30 mg/dL — AB
SPECIFIC GRAVITY, URINE: 1.029 (ref 1.005–1.030)
Squamous Epithelial / HPF: NONE SEEN
pH: 5 (ref 5.0–8.0)

## 2016-04-13 LAB — LIPASE, BLOOD: Lipase: <10 U/L — ABNORMAL LOW (ref 11–51)

## 2016-04-13 LAB — ETHANOL

## 2016-04-13 MED ORDER — SODIUM CHLORIDE 0.9 % IV BOLUS (SEPSIS)
1000.0000 mL | Freq: Once | INTRAVENOUS | Status: AC
  Administered 2016-04-13: 1000 mL via INTRAVENOUS

## 2016-04-13 MED ORDER — GI COCKTAIL ~~LOC~~
30.0000 mL | Freq: Once | ORAL | Status: AC
  Administered 2016-04-13: 30 mL via ORAL
  Filled 2016-04-13: qty 30

## 2016-04-13 MED ORDER — FAMOTIDINE 20 MG PO TABS: 20.0000 mg | ORAL_TABLET | Freq: Two times a day (BID) | ORAL | 0 refills | Status: AC

## 2016-04-13 MED ORDER — ONDANSETRON HCL 4 MG/2ML IJ SOLN
4.0000 mg | Freq: Once | INTRAMUSCULAR | Status: AC
  Administered 2016-04-13: 4 mg via INTRAVENOUS
  Filled 2016-04-13: qty 2

## 2016-04-13 MED ORDER — LEVETIRACETAM 500 MG PO TABS
500.0000 mg | ORAL_TABLET | Freq: Once | ORAL | Status: AC
  Administered 2016-04-13: 500 mg via ORAL
  Filled 2016-04-13: qty 1

## 2016-04-13 MED ORDER — ONDANSETRON 4 MG PO TBDP: 4.0000 mg | ORAL_TABLET | Freq: Three times a day (TID) | ORAL | 0 refills | Status: AC | PRN

## 2016-04-13 MED ORDER — LEVETIRACETAM 500 MG PO TABS: 500.0000 mg | ORAL_TABLET | Freq: Two times a day (BID) | ORAL | 0 refills | Status: AC

## 2016-04-13 NOTE — ED Triage Notes (Signed)
BIB EMS from home, reports RLQ abd pain present since yestereday, describes as cramping and rates 6/10.0 Reports N/V. Also reports a possible seizure, per family pt has R arm and R leg shaking, eyes rolled back in head and pt passed out for a few minutes. This happened around midnight. Pt is deaf, interpreter was used to get info. A/OX4. VSS.

## 2016-04-13 NOTE — Discharge Instructions (Signed)
Take your medications as prescribed. Continue drinking fluids at home to remain hydrated. I recommended eating a bland diet for the next few days until your symptoms have improved. Call the neurology clinic listed below to schedule a follow-up appointment for further management and evaluation regarding your seizures. Please return to the Emergency Department if symptoms worsen or new onset of fever, headache, visual changes, dizziness, chest pain, difficulty breathing, new/worsening abdominal pain, vomiting, unable to keep fluids down, diarrhea, numbness, weakness, syncope, seizure, altered mental status.

## 2016-04-13 NOTE — ED Notes (Signed)
EKG obtained and given to MD.

## 2016-04-13 NOTE — ED Provider Notes (Signed)
[facility]-EMERGENCY DEPT Provider Note   CSN: [id number] Arrival date & time: 04/13/16  [id number]     History   Chief Complaint Chief Complaint  Patient presents with  . Abdominal Pain  . Possible Seizure?    HPI Nathan Bass is a 31 y.o. male.  HPI   Patient is a 31 year old male who presents today with multiple complaints. Patient is deaf, video interpreter was used during interview and examination. Patient reports yesterday around midnight he began having intermittent cramping pain to his upper abdomen. Patient reports pain is worse with standing up. Patient reports having intermittent episodes of lightheadedness upon standing due to severe worsening abdominal pain. He also endorses having associated nausea and 3 episodes of NBNB vomiting last night. Patient states he has not had anything to eat or drink due to his symptoms. He attributes most of the symptoms to stress and his significant other at bedside reports that patient's father recently passed away which has seemed to worsen his symptoms. Patient reports taking Bentyl at home without relief. Patient also reports having a seizure last night. Significant other reports patient was found having seizure like activity to the right side of his body that lasted a few minutes. Patient states when he woke up he was confused and was not sure what had happened. Significant other reports patient has had similar seizures over the past few months but notes this was his first one within the past month and a half. Significant other states patient was evaluated in December for similar symptoms. Patient reports missing his follow-up appointment with neurology which was scheduled for last week. Denies currently being on any seizure medication. Denies fever, chills, headache, visual changes, cough, shortness of breath, chest pain, hematemesis, diarrhea, urinary symptoms, blood in urine or stool, flank pain, neck or back pain, numbness, tingling, weakness, rash.  Patient denies history of abdominal surgeries. Denies any drug or alcohol use.  Past Medical History:  Diagnosis Date  . Deaf     There are no active problems to display for this patient.   History reviewed. No pertinent surgical history.     Home Medications    Prior to Admission medications   Medication Sig Start Date End Date Taking? Authorizing Provider  dicyclomine (BENTYL) 20 MG tablet Take 1 tablet (20 mg total) by mouth 3 (three) times daily before meals. 08/29/15  Yes Riki SheerMichelle G Young, PA-C  pantoprazole (PROTONIX) 40 MG tablet Take 40 mg by mouth 2 (two) times daily.  11/14/15  Yes Historical Provider, MD  famotidine (PEPCID) 20 MG tablet Take 1 tablet (20 mg total) by mouth 2 (two) times daily. Patient not taking: Reported on 01/16/2016 02/02/15   Joni ReiningNicole Pisciotta, PA-C  famotidine (PEPCID) 20 MG tablet Take 1 tablet (20 mg total) by mouth 2 (two) times daily. 04/13/16   Barrett HenleNicole Elizabeth Nadeau, PA-C  levETIRAcetam (KEPPRA) 500 MG tablet Take 1 tablet (500 mg total) by mouth 2 (two) times daily. 04/13/16   Barrett HenleNicole Elizabeth Nadeau, PA-C  ondansetron (ZOFRAN ODT) 4 MG disintegrating tablet Take 1 tablet (4 mg total) by mouth every 8 (eight) hours as needed for nausea or vomiting. 04/13/16   Barrett HenleNicole Elizabeth Nadeau, PA-C  promethazine (PHENERGAN) 25 MG tablet Take 1 tablet (25 mg total) by mouth every 6 (six) hours as needed for nausea or vomiting. Patient not taking: Reported on 01/16/2016 02/02/15   Wynetta EmeryNicole Pisciotta, PA-C    Family History No family history on file.  Social History Social History  Substance Use Topics  .  Smoking status: Never Smoker  . Smokeless tobacco: Never Used  . Alcohol use No     Allergies   Patient has no known allergies.   Review of Systems Review of Systems  Gastrointestinal: Positive for abdominal pain, nausea and vomiting.  Neurological: Positive for seizures and light-headedness.  All other systems reviewed and are  negative.    Physical Exam Updated Vital Signs BP 117/88   Pulse 104   Temp 98 F (36.7 C) (Oral)   Resp 17   Ht 5\' 10"  (1.778 m)   Wt 63.5 kg   SpO2 100%   BMI 20.09 kg/m   Physical Exam  Constitutional: He is oriented to person, place, and time. He appears well-developed and well-nourished. No distress.  Pt is deaf  HENT:  Head: Normocephalic and atraumatic.  Right Ear: Tympanic membrane normal.  Left Ear: Tympanic membrane normal.  Nose: Nose normal.  Mouth/Throat: Uvula is midline, oropharynx is clear and moist and mucous membranes are normal. No oropharyngeal exudate, posterior oropharyngeal edema, posterior oropharyngeal erythema or tonsillar abscesses. No tonsillar exudate.  Eyes: Conjunctivae and EOM are normal. Pupils are equal, round, and reactive to light. Right eye exhibits no discharge. Left eye exhibits no discharge. No scleral icterus.  Neck: Normal range of motion. Neck supple.  Cardiovascular: Normal rate, regular rhythm, normal heart sounds and intact distal pulses.   Pulmonary/Chest: Effort normal and breath sounds normal. No respiratory distress. He has no wheezes. He has no rales. He exhibits no tenderness.  Abdominal: Soft. Normal appearance and bowel sounds are normal. He exhibits no distension and no mass. There is tenderness. There is no rigidity, no rebound, no guarding, no CVA tenderness and negative Murphy's sign. No hernia.  Mild TTP over epigastric region  Musculoskeletal: Normal range of motion. He exhibits no edema or tenderness.  Lymphadenopathy:    He has no cervical adenopathy.  Neurological: He is alert and oriented to person, place, and time. He has normal strength. No cranial nerve deficit or sensory deficit. Coordination normal.  Skin: Skin is warm and dry. He is not diaphoretic.  Nursing note and vitals reviewed.    ED Treatments / Results  Labs (all labs ordered are listed, but only abnormal results are displayed) Labs Reviewed   COMPREHENSIVE METABOLIC PANEL - Abnormal; Notable for the following:       Result Value   Glucose, Bld 102 (*)    All other components within normal limits  LIPASE, BLOOD - Abnormal; Notable for the following:    Lipase <10 (*)    All other components within normal limits  URINALYSIS, ROUTINE W REFLEX MICROSCOPIC - Abnormal; Notable for the following:    APPearance HAZY (*)    Ketones, ur 80 (*)    Protein, ur 30 (*)    All other components within normal limits  RAPID URINE DRUG SCREEN, HOSP PERFORMED - Abnormal; Notable for the following:    Tetrahydrocannabinol POSITIVE (*)    All other components within normal limits  CBC WITH DIFFERENTIAL/PLATELET  ETHANOL    EKG  EKG Interpretation  Date/Time:  Tuesday April 13 2016 06:24:14 EDT Ventricular Rate:  74 PR Interval:    QRS Duration: 79 QT Interval:  371 QTC Calculation: 412 R Axis:   75 Text Interpretation:  Sinus arrhythmia Multiple ventricular premature complexes ST elev, probable normal early repol pattern No significant change since last tracing Confirmed by Bebe Shaggy  MD, DONALD ([id number]) on 04/13/2016 6:29:39 AM       Radiology  No results found.  Procedures Procedures (including critical care time)  Medications Ordered in ED Medications  sodium chloride 0.9 % bolus 1,000 mL (0 mLs Intravenous Stopped 04/13/16 0938)  ondansetron (ZOFRAN) injection 4 mg (4 mg Intravenous Given 04/13/16 0744)  gi cocktail (Maalox,Lidocaine,Donnatal) (30 mLs Oral Given 04/13/16 0744)  levETIRAcetam (KEPPRA) tablet 500 mg (500 mg Oral Given 04/13/16 1012)     Initial Impression / Assessment and Plan / ED Course  I have reviewed the triage vital signs and the nursing notes.  Pertinent labs & imaging results that were available during my care of the patient were reviewed by me and considered in my medical decision making (see chart for details).     Patient presents with upper abdominal pain with associated nausea and vomiting that  started last night. Patient reports having associated lightheadedness upon standing and states he has not had anything to eat or drink since onset of symptoms. He also reports having a seizure last night which is consistent with similar seizures he has had in the past. Patient states he was evaluated for initial seizure in the ED in December but denies following up with neurology. VSS. Exam revealed mild tenderness over epigastric region, no peritoneal signs. Remaining exam unremarkable. No neuro deficits. Patient given IV fluids, Zofran and GI cocktail in the ED.   Chart review shows patient's ED visit on 01/16/16 for syncope/seizure he had negative head CT and labs unremarkable. Labs in the ED today showed UA with ketones, no hgb, no signs of infection. UDS positive for THC. Remaining labs unremarkable. Consulted neurology regarding pt's multiple seizures. Dr. Cena Benton recommends due to pt without seizures in the ED, plan to start pt on Keppra 500mg  BID and follow up with neurology outpatient.   On reevaluation, pt reports he is feeling much better. Pt able to tolerate PO. On repeat exam patient does not have a surgical abdomen and there are no peritoneal signs.  No indication of appendicitis, bowel obstruction, bowel perforation, cholecystitis, diverticulitis.  Patient discharged home with symptomatic treatment and given strict instructions for follow-up with their primary care physician.  I have also discussed reasons to return immediately to the ER.  Patient expresses understanding and agrees with plan.   Final Clinical Impressions(s) / ED Diagnoses   Final diagnoses:  Epigastric pain  Seizure (HCC)    New Prescriptions New Prescriptions   FAMOTIDINE (PEPCID) 20 MG TABLET    Take 1 tablet (20 mg total) by mouth 2 (two) times daily.   LEVETIRACETAM (KEPPRA) 500 MG TABLET    Take 1 tablet (500 mg total) by mouth 2 (two) times daily.   ONDANSETRON (ZOFRAN ODT) 4 MG DISINTEGRATING TABLET    Take 1  tablet (4 mg total) by mouth every 8 (eight) hours as needed for nausea or vomiting.     Barrett Henle, PA-C 04/13/16 1024    Melene Plan, DO 04/13/16 1030

## 2016-04-13 NOTE — ED Notes (Signed)
Pt ambulated in room no dizziness or lightheadedness reported

## 2016-04-13 NOTE — ED Notes (Signed)
Pt family member called out, upon entering room pt noted to be laying face down on bed, states that the pain was so bad he "blacked out" pt was able to get in bed, A/OX4, noted to be crying. All info was obtained via video interpreter. Joni ReiningNicole PA aware

## 2016-06-07 ENCOUNTER — Encounter (HOSPITAL_COMMUNITY): Payer: Self-pay

## 2016-06-07 ENCOUNTER — Emergency Department (HOSPITAL_COMMUNITY): Payer: Medicaid Other

## 2016-06-07 ENCOUNTER — Emergency Department (HOSPITAL_COMMUNITY)
Admission: EM | Admit: 2016-06-07 | Discharge: 2016-06-07 | Disposition: A | Discharge status: Home / Self Care | Payer: Medicaid Other | Attending: Emergency Medicine | Admitting: Emergency Medicine

## 2016-06-07 DIAGNOSIS — M25561 Pain in right knee: Secondary | ICD-10-CM | POA: Diagnosis present

## 2016-06-07 DIAGNOSIS — Y9301 Activity, walking, marching and hiking: Secondary | ICD-10-CM | POA: Insufficient documentation

## 2016-06-07 DIAGNOSIS — Y999 Unspecified external cause status: Secondary | ICD-10-CM | POA: Diagnosis not present

## 2016-06-07 DIAGNOSIS — Y929 Unspecified place or not applicable: Secondary | ICD-10-CM | POA: Insufficient documentation

## 2016-06-07 DIAGNOSIS — W109XXA Fall (on) (from) unspecified stairs and steps, initial encounter: Secondary | ICD-10-CM | POA: Insufficient documentation

## 2016-06-07 DIAGNOSIS — Z79899 Other long term (current) drug therapy: Secondary | ICD-10-CM | POA: Insufficient documentation

## 2016-06-07 NOTE — ED Notes (Signed)
PT DISCHARGED. INSTRUCTIONS GIVEN. AAOX4. PT IN NO APPARENT DISTRESS. THE OPPORTUNITY TO ASK QUESTIONS WAS PROVIDED. 

## 2016-06-07 NOTE — ED Triage Notes (Signed)
With sign language interpretor, pt states he was jumping around.  Has previous knee injury.  Pain was severe and pt pain made him feel light headed.

## 2016-06-07 NOTE — ED Triage Notes (Signed)
Per EMS, pt from home.  Pt states dizziness with fall within last hour.  Pt fell landing on right knee.  Pt is ambulatory on scene.  Ice applied in route.  CBG 81.  Vitals:  132/92, hr 80, resp 18, 98% ra.  Pt is hearing impaired.

## 2016-06-07 NOTE — Discharge Instructions (Signed)
There was no fracture seen on your x-rays. Wear the knee immobilizer and use the crutches to ambulate as needed. Rest, ice, heat, compression, and elevation of the knee will be helpful. Alternate ibuprofen and Tylenol every 3 hours for the next few days for ear pain. Gentle stretching in the shower may be helpful as well. Follow up with the orthopedic doctors for reevaluation of your knee, especially if symptoms persist. Return to the emergency Department if any concerning symptoms develop such as fever, chills, swelling, numbness or tingling, redness/warmth to the joint, calf pain/swelling, confusion, or loss of control of bowel or bladder.

## 2016-06-07 NOTE — ED Provider Notes (Signed)
[facility]-EMERGENCY DEPT Provider Note   CSN: [id number] Arrival date & time: 06/07/16  1328  By signing my name below, I, Nathan Bass, attest that this documentation has been prepared under the direction and in the presence of [name], [place]. Electronically Signed: Teofilo Bass, ED Scribe. 06/07/2016. 2:31 PM.    History   Chief Complaint Chief Complaint  Patient presents with  . Dizziness  . Fall    The history is provided by the patient. The history is limited by a language barrier. A language interpreter was used (ID#: [id number]).   HPI Comments:  Nathan Bass is a 31 y.o. male with PMHx of deafness who presents to the Emergency Department complaining of constant right knee pain following an injury PTA. A language interpreter was used to obtain history. Pt reports that he was walking down a small set of steps and jumped from the top step to the bottom "for fun" and landed awkwardly on the right knee, causing immediate pain and an episode of lightheadedness that lasted a few seconds but has not returned. He denies syncope or loss of consciousness. He states that the pain is constant, and severely worsened with walking, movement, and with direct pressure. He states that the pain does not radiate and is primarily located at the bottom of his knee anteriorly. He denies any head trauma. He reports a previous right knee injury >10 years ago. Pt has used ice with mild relief. Denies numbness, tingling, weakness, headache, dizziness, chest pain, SOB, abdominal pain, nausea, vomiting, back pain, neck pain.   Past Medical History:  Diagnosis Date  . Deaf     There are no active problems to display for this patient.   History reviewed. No pertinent surgical history.     Home Medications    Prior to Admission medications   Medication Sig Start Date End Date Taking? Authorizing Provider  dicyclomine (BENTYL) 20 MG tablet Take 1 tablet (20 mg total) by mouth 3 (three) times  daily before meals. 08/29/15   [name], PA-C  famotidine (PEPCID) 20 MG tablet Take 1 tablet (20 mg total) by mouth 2 (two) times daily. Patient not taking: Reported on 01/16/2016 02/02/15   Pisciotta, [name], PA-C  famotidine (PEPCID) 20 MG tablet Take 1 tablet (20 mg total) by mouth 2 (two) times daily. 04/13/16   Barrett Henle, PA-C  levETIRAcetam (KEPPRA) 500 MG tablet Take 1 tablet (500 mg total) by mouth 2 (two) times daily. 04/13/16   Barrett Henle, PA-C  ondansetron (ZOFRAN ODT) 4 MG disintegrating tablet Take 1 tablet (4 mg total) by mouth every 8 (eight) hours as needed for nausea or vomiting. 04/13/16   Barrett Henle, PA-C  pantoprazole (PROTONIX) 40 MG tablet Take 40 mg by mouth 2 (two) times daily.  11/14/15   [provider]  promethazine (PHENERGAN) 25 MG tablet Take 1 tablet (25 mg total) by mouth every 6 (six) hours as needed for nausea or vomiting. Patient not taking: Reported on 01/16/2016 02/02/15   Pisciotta, Mardella Layman    Family History History reviewed. No pertinent family history.  Social History Social History  Substance Use Topics  . Smoking status: Never Smoker  . Smokeless tobacco: Never Used  . Alcohol use No     Allergies   Patient has no known allergies.   Review of Systems Review of Systems  Respiratory: Negative for shortness of breath.   Cardiovascular: Negative for chest pain.  Gastrointestinal: Negative for anal bleeding,  nausea and vomiting.  Musculoskeletal: Positive for arthralgias. Negative for back pain and neck pain.  Neurological: Negative for dizziness, syncope, weakness, numbness and headaches.     Physical Exam Updated Vital Signs BP (!) 131/98 (BP Location: Right Arm)   Pulse 95   Temp 98.7 F (37.1 C) (Oral)   Resp 14   Ht 5\' 10"  (1.778 m)   Wt 65.8 kg   SpO2 98%   BMI 20.81 kg/m   Physical Exam  Constitutional: He appears well-developed and well-nourished. No distress.    HENT:  Head: Normocephalic and atraumatic.  Eyes: Conjunctivae are normal. Right eye exhibits no discharge. Left eye exhibits no discharge. No scleral icterus.  Cardiovascular: Normal rate and intact distal pulses.   2+ tib ant and posterior tibialis pulses bl. Negative Homans bilaterally  Pulmonary/Chest: Effort normal.  Abdominal: He exhibits no distension.  Musculoskeletal:  Severely limited range of motion of right knee due to pain. Somewhat limited range of motion of right ankle due to pain 5/5 strength of bilateral ankles and feet and EHL. No varus or valgus deformity, negative anterior posterior drawer test, maximally tender in the inferior pole of the patella, with some tenderness along the joint line and MCL and LCL. No significant swelling. Erythema of the overlying skin due to ice.   Neurological: He is alert.  No facial droop, fluently signs, patient unable to bear weight on right lower extremity. Sensation intact globally.   Skin: Skin is warm and dry. Capillary refill takes less than 2 seconds.  Psychiatric: He has a normal mood and affect.  Nursing note and vitals reviewed.    ED Treatments / Results  DIAGNOSTIC STUDIES:  Oxygen Saturation is 96% on RA, normal by my interpretation.    COORDINATION OF CARE:  2:20 PM Discussed treatment plan with pt at bedside and pt agreed to plan.   Labs (all labs ordered are listed, but only abnormal results are displayed) Labs Reviewed - No data to display  EKG  EKG Interpretation None       Radiology Dg Knee Complete 4 Views Right  Result Date: 06/07/2016 CLINICAL DATA:  Status post fall landing on the right knee. Previous knee injury as well. EXAM: RIGHT KNEE - COMPLETE 4+ VIEW COMPARISON:  None in PACs FINDINGS: The bones are subjectively adequately mineralized. The joint spaces are well maintained. There is mild beaking of the tibial spines. There is no acute fracture or dislocation. There may be a small suprapatellar  effusion. IMPRESSION: There is no acute bony abnormality of the right knee. There may be a small suprapatellar effusion Electronically Signed   By: David  Swazil[place]Jordan M.D.   On: 06/07/2016 16:07    Procedures Procedures (including critical care time)  Medications Ordered in ED Medications - No data to display   Initial Impression / Assessment and Plan / ED Course  I have reviewed the triage vital signs and the nursing notes.  Pertinent labs & imaging results that were available during my care of the patient were reviewed by me and considered in my medical decision making (see chart for details).     Patient with right knee pain and limited range of motion after fall earlier today. No loss of consciousness, neurovascularly intact. Unable to bear weight. Compartments are soft. X-rays negative for fracture or dislocation. No sign of ligamentous laxity. Knee immobilizer placed and crutches provided. Discussed rice therapy as well as heat or ice and gentle stretching. Recommend follow-up with orthopedics in one week  for reevaluation. Discussed strict ED return precautions. All of this was discussed through the use of a translator and patient signed understanding of and agreement with plan. Patient is safe for discharge home at this time.   Final Clinical Impressions(s) / ED Diagnoses   Final diagnoses:  Acute pain of right knee    New Prescriptions Discharge Medication List as of 06/07/2016  4:20 PM    I personally performed the services described in this documentation, which was scribed in my presence. The recorded information has been reviewed and is accurate.    Jeanie Sewer, PA-C 06/08/16 1424    Derwood Kaplan, MD 06/08/16 2240

## 2017-02-18 IMAGING — CT CT HEAD W/O CM
3 of 4 series · 15 of 47 positions shown, 18 images · non-contrast
Comparison: None.

CLINICAL DATA: Syncopal episode, unwitnessed fall. RIGHT posterior
scalp hematoma.

EXAM:
CT HEAD WITHOUT CONTRAST
TECHNIQUE: Contiguous axial images were obtained from the base of the skull
through the vertex without intravenous contrast.

[Series 2: head w/o · axial · non-contrast · 0.45mm/px · z∈[+1596,+1721]mm · 9 of 33 slices shown, 12 images]
[im 4/33  brain]
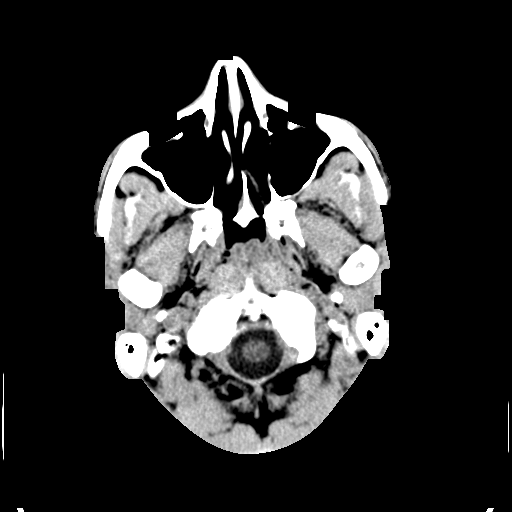
[im 4/33  bone]
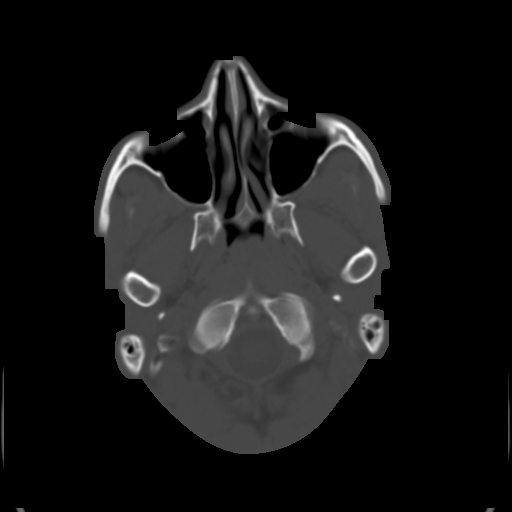
[im 7/33  brain]
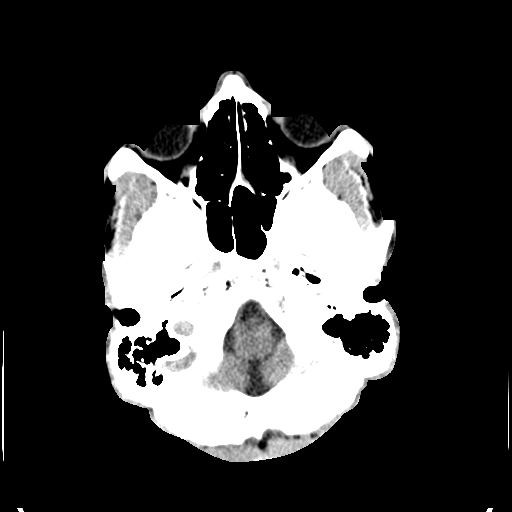
[im 10/33  brain]
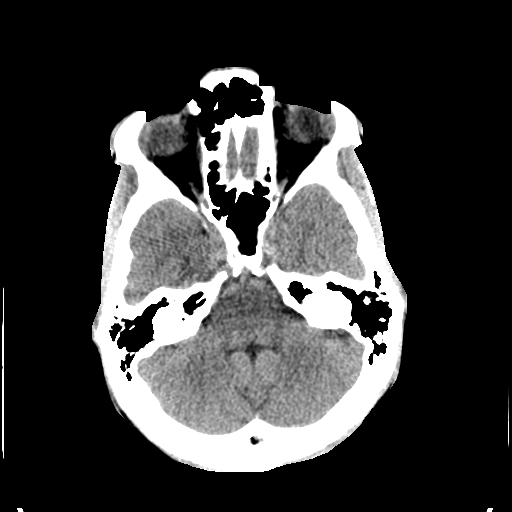
[im 13/33  brain]
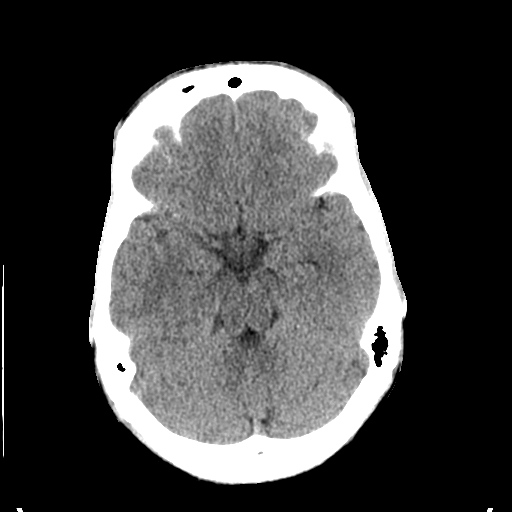
[im 17/33  brain]
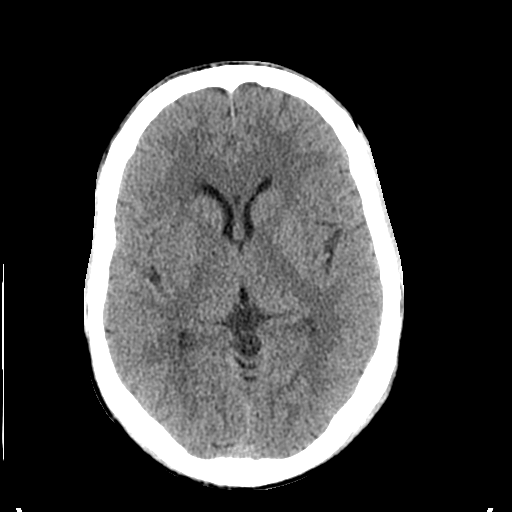
[im 17/33  bone]
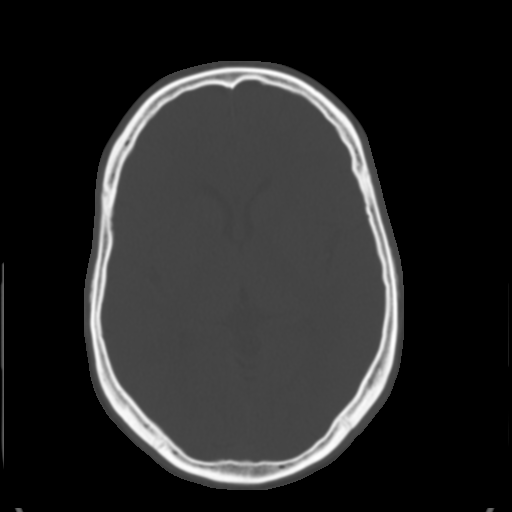
[im 20/33  brain]
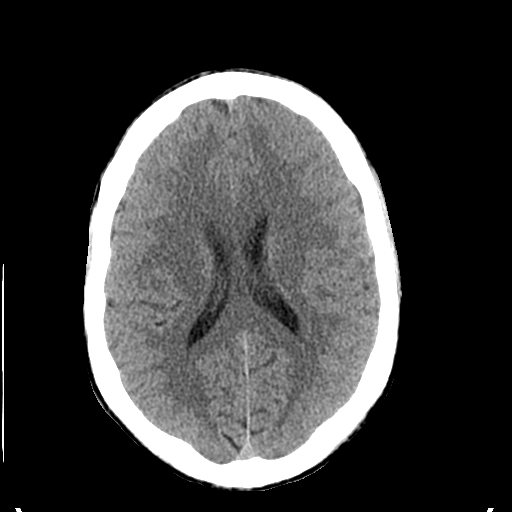
[im 23/33  brain]
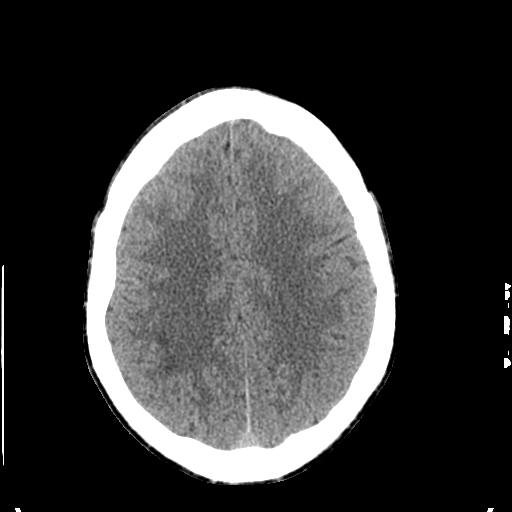
[im 26/33  brain]
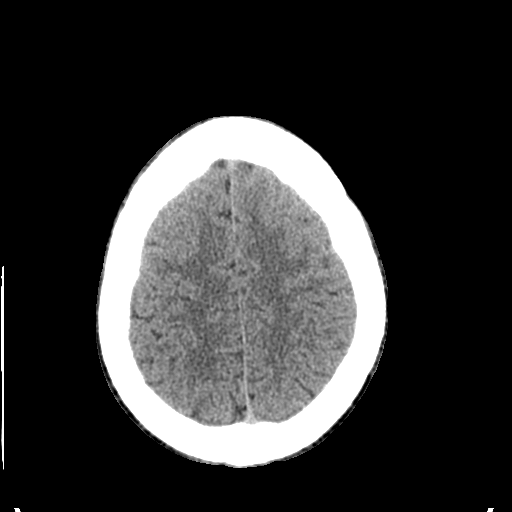
[im 29/33  brain]
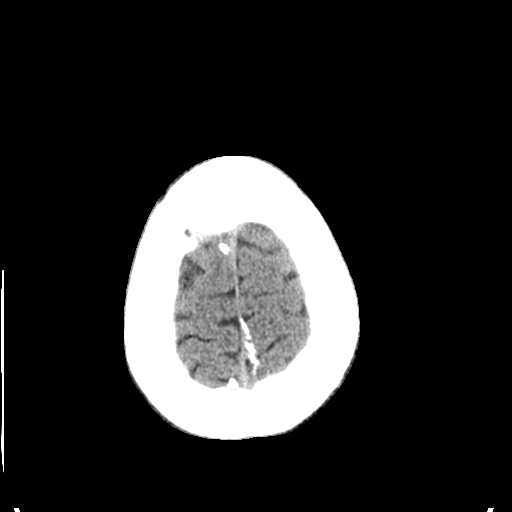
[im 29/33  bone]
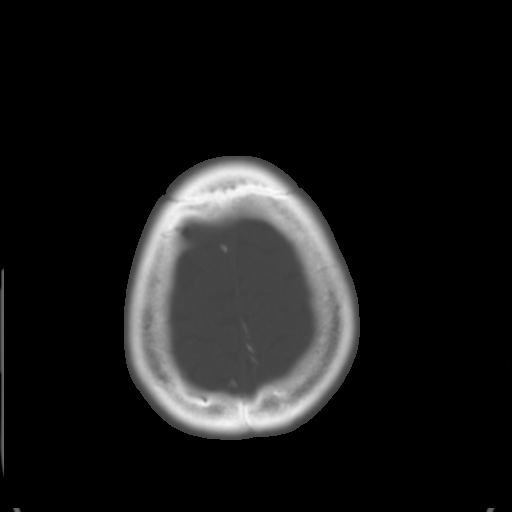

[Series 4: coronal · coronal · 0.30mm/px · 3 of 77 slices shown]
[im 26/77  brain]
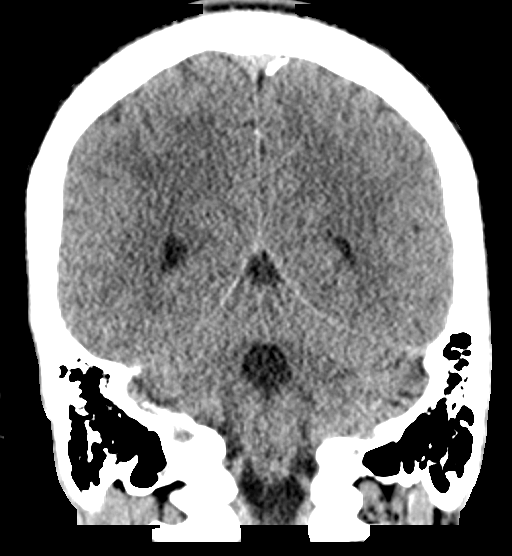
[im 34/77  brain]
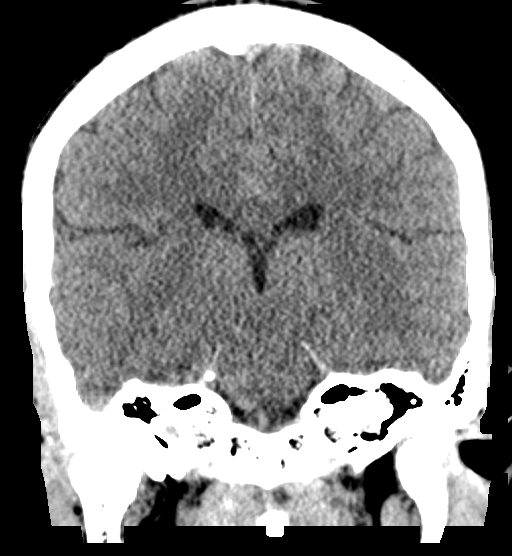
[im 43/77  brain]
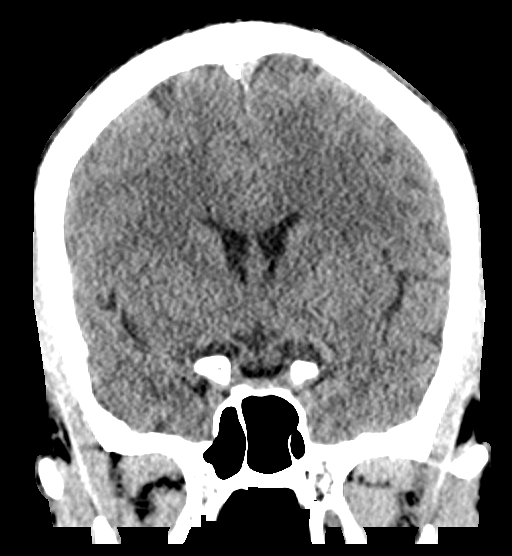

[Series 5: sagittal · sagittal · 0.31mm/px · 3 of 49 slices shown]
[im 17/49  brain]
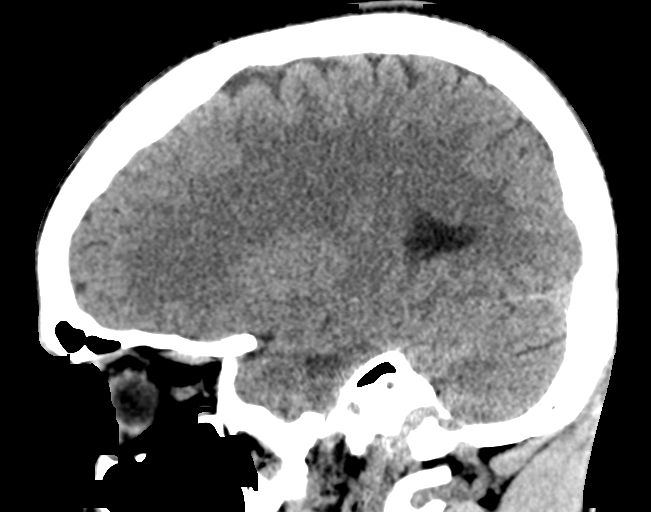
[im 25/49  brain]
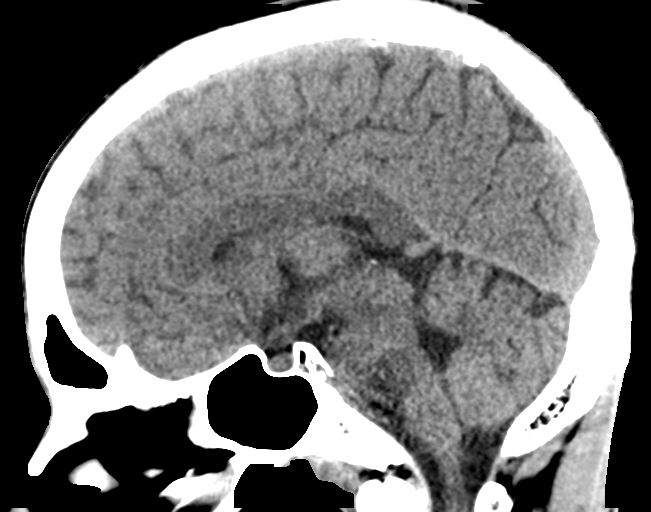
[im 33/49  brain]
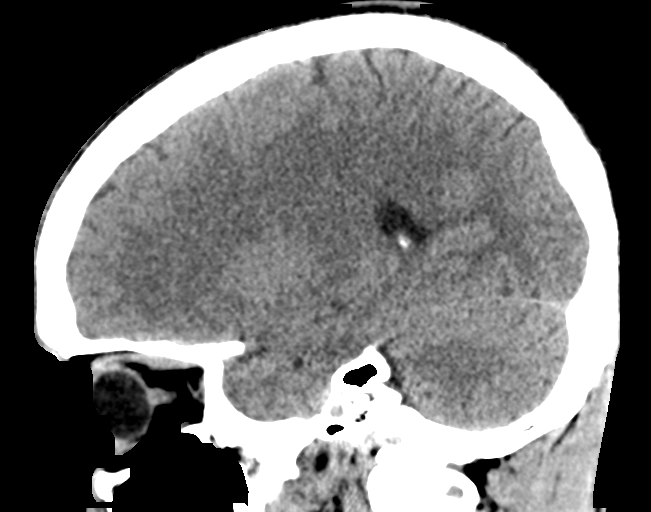

[15 of 47 positions shown; findings below may reference images not displayed]

FINDINGS: BRAIN: The ventricles and sulci are normal. No intraparenchymal
hemorrhage, mass effect nor midline shift. No acute large vascular
territory infarcts. No abnormal extra-axial fluid collections. Basal
cisterns are patent.

VASCULAR: Unremarkable.

SKULL/SOFT TISSUES: No skull fracture. No significant soft tissue
swelling.

ORBITS/SINUSES: The included ocular globes and orbital contents are
normal.The mastoid aircells and included paranasal sinuses are
well-aerated.

OTHER: None.
IMPRESSION: Normal CT HEAD.
